# Patient Record
Sex: Male | Born: 1985 | Race: Black or African American | Hispanic: No | Marital: Single | Smoking: Current some day smoker
Health system: Southern US, Community
[De-identification: ages and names within clinical notes are randomized; demographics above are authoritative.]

## PROBLEM LIST (undated history)

## (undated) DIAGNOSIS — F418 Other specified anxiety disorders: Secondary | ICD-10-CM

## (undated) HISTORY — PX: NO PAST SURGERIES: SHX2092

## (undated) HISTORY — DX: Other specified anxiety disorders: F41.8

---

## 2011-11-30 ENCOUNTER — Ambulatory Visit: Payer: BC Managed Care – PPO | Admitting: Family Medicine

## 2011-11-30 VITALS — BP 121/77 | HR 79 | Temp 99.0°F | Resp 16 | Ht 69.0 in | Wt 218.0 lb

## 2011-11-30 DIAGNOSIS — R300 Dysuria: Secondary | ICD-10-CM

## 2011-11-30 LAB — POCT UA - MICROSCOPIC ONLY
Casts, Ur, LPF, POC: NEGATIVE
Crystals, Ur, HPF, POC: NEGATIVE
Epithelial cells, urine per micros: NEGATIVE
Yeast, UA: NEGATIVE

## 2011-11-30 LAB — POCT URINALYSIS DIPSTICK
Bilirubin, UA: NEGATIVE
Blood, UA: NEGATIVE
Nitrite, UA: NEGATIVE
pH, UA: 6

## 2011-11-30 MED ORDER — AZITHROMYCIN 500 MG PO TABS: 1000.0000 mg | ORAL_TABLET | Freq: Every day | ORAL | Status: DC

## 2011-11-30 NOTE — Progress Notes (Signed)
  Subjective:    Patient ID: Timothy Vasquez, male    DOB: [birth year], 26 y.o.   MRN: [id number]  HPI 26 yo male with groin pain.  Started yesterday.  Comes and goes.  Hasn't tried any medicines.  Some dysuria - tingles.  Question slight discharge.  Does have unprotected sex.  SAme symptoms when 19 and had an STD.  Can't remember which.     Review of Systems    Negative except as per HPI  Objective:   Physical Exam  Constitutional: He appears well-developed.  Pulmonary/Chest: Effort normal.  Genitourinary: Testes normal. Circumcised. Penile tenderness present. Discharge found.  Neurological: He is alert.    Results for orders placed in visit on 11/30/11  POCT UA - MICROSCOPIC ONLY      Component Value Range   WBC, Ur, HPF, POC 5-7     RBC, urine, microscopic 6-7     Bacteria, U Microscopic neg     Mucus, UA trace     Epithelial cells, urine per micros neg     Crystals, Ur, HPF, POC neg     Casts, Ur, LPF, POC neg     Yeast, UA neg    POCT URINALYSIS DIPSTICK      Component Value Range   Color, UA yellow     Clarity, UA clear     Glucose, UA neg     Bilirubin, UA neg     Ketones, UA trace     Spec Grav, UA 1.025     Blood, UA neg     pH, UA 6.0     Protein, UA neg     Urobilinogen, UA 2.0     Nitrite, UA neg     Leukocytes, UA Negative           Assessment & Plan:  Dysuria with discharge.  Uriprobe pending.  Chlamydia most likely.  Given Rx for Azithro 1 g x 1.  Discussed that if comes back positive for gonorrhea, he will need to return for shot.  He is okay with that.

## 2011-12-02 ENCOUNTER — Other Ambulatory Visit: Payer: Self-pay | Admitting: Family Medicine

## 2011-12-02 LAB — URINE CULTURE: Colony Count: NO GROWTH

## 2011-12-04 ENCOUNTER — Emergency Department (HOSPITAL_COMMUNITY)
Admission: EM | Admit: 2011-12-04 | Discharge: 2011-12-04 | Disposition: A | Discharge status: Home / Self Care | Payer: No Typology Code available for payment source | Attending: Emergency Medicine | Admitting: Emergency Medicine

## 2011-12-04 ENCOUNTER — Encounter (HOSPITAL_COMMUNITY): Payer: Self-pay | Admitting: Emergency Medicine

## 2011-12-04 ENCOUNTER — Emergency Department (HOSPITAL_COMMUNITY): Payer: No Typology Code available for payment source

## 2011-12-04 DIAGNOSIS — S43429A Sprain of unspecified rotator cuff capsule, initial encounter: Secondary | ICD-10-CM | POA: Insufficient documentation

## 2011-12-04 DIAGNOSIS — M25519 Pain in unspecified shoulder: Secondary | ICD-10-CM | POA: Insufficient documentation

## 2011-12-04 DIAGNOSIS — M542 Cervicalgia: Secondary | ICD-10-CM | POA: Insufficient documentation

## 2011-12-04 DIAGNOSIS — S46019A Strain of muscle(s) and tendon(s) of the rotator cuff of unspecified shoulder, initial encounter: Secondary | ICD-10-CM

## 2011-12-04 DIAGNOSIS — S161XXA Strain of muscle, fascia and tendon at neck level, initial encounter: Secondary | ICD-10-CM

## 2011-12-04 DIAGNOSIS — S060X1A Concussion with loss of consciousness of 30 minutes or less, initial encounter: Secondary | ICD-10-CM | POA: Insufficient documentation

## 2011-12-04 DIAGNOSIS — S139XXA Sprain of joints and ligaments of unspecified parts of neck, initial encounter: Secondary | ICD-10-CM | POA: Insufficient documentation

## 2011-12-04 DIAGNOSIS — R51 Headache: Secondary | ICD-10-CM | POA: Insufficient documentation

## 2011-12-04 DIAGNOSIS — R404 Transient alteration of awareness: Secondary | ICD-10-CM | POA: Insufficient documentation

## 2011-12-04 MED ORDER — CYCLOBENZAPRINE HCL 10 MG PO TABS: 10.0000 mg | ORAL_TABLET | Freq: Two times a day (BID) | ORAL | Status: AC | PRN

## 2011-12-04 MED ORDER — HYDROCODONE-ACETAMINOPHEN 5-325 MG PO TABS: 1.0000 | ORAL_TABLET | ORAL | Status: AC | PRN

## 2011-12-04 NOTE — Discharge Instructions (Signed)
Cervical Sprain A cervical sprain is an injury in the neck in which the ligaments are stretched or torn. The ligaments are the tissues that hold the bones of the neck (vertebrae) in place.Cervical sprains can range from very mild to very severe. Most cervical sprains get better in 1 to 3 weeks, but it depends on the cause and extent of the injury. Severe cervical sprains can cause the neck vertebrae to be unstable. This can lead to damage of the spinal cord and can result in serious nervous system problems. Your caregiver will determine whether your cervical sprain is mild or severe. CAUSES  Severe cervical sprains may be caused by:  Contact sport injuries (football, rugby, wrestling, hockey, auto racing, gymnastics, diving, martial arts, boxing).   Motor vehicle collisions.   Whiplash injuries. This means the neck is forcefully whipped backward and forward.   Falls.  Mild cervical sprains may be caused by:   Awkward positions, such as cradling a telephone between your ear and shoulder.   Sitting in a chair that does not offer proper support.   Working at a poorly designed computer station.   Activities that require looking up or down for long periods of time.  SYMPTOMS   Pain, soreness, stiffness, or a burning sensation in the front, back, or sides of the neck. This discomfort may develop immediately after injury or it may develop slowly and not begin for 24 hours or more after an injury.   Pain or tenderness directly in the middle of the back of the neck.   Shoulder or upper back pain.   Limited ability to move the neck.   Headache.   Dizziness.   Weakness, numbness, or tingling in the hands or arms.   Muscle spasms.   Difficulty swallowing or chewing.   Tenderness and swelling of the neck.  DIAGNOSIS  Most of the time, your caregiver can diagnose this problem by taking your history and doing a physical exam. Your caregiver will ask about any known problems, such as  arthritis in the neck or a previous neck injury. X-rays may be taken to find out if there are any other problems, such as problems with the bones of the neck. However, an X-ray often does not reveal the full extent of a cervical sprain. Other tests such as a computed tomography (CT) scan or magnetic resonance imaging (MRI) may be needed. TREATMENT  Treatment depends on the severity of the cervical sprain. Mild sprains can be treated with rest, keeping the neck in place (immobilization), and pain medicines. Severe cervical sprains need immediate immobilization and an appointment with an orthopedist or neurosurgeon. Several treatment options are available to help with pain, muscle spasms, and other symptoms. Your caregiver may prescribe:  Medicines, such as pain relievers, numbing medicines, or muscle relaxants.   Physical therapy. This can include stretching exercises, strengthening exercises, and posture training. Exercises and improved posture can help stabilize the neck, strengthen muscles, and help stop symptoms from returning.   A neck collar to be worn for short periods of time. Often, these collars are worn for comfort. However, certain collars may be worn to protect the neck and prevent further worsening of a serious cervical sprain.  HOME CARE INSTRUCTIONS   Put ice on the injured area.   Put ice in a plastic bag.   Place a towel between your skin and the bag.   Leave the ice on for 15 to 20 minutes, 3 to 4 times a day.     Only take over-the-counter or prescription medicines for pain, discomfort, or fever as directed by your caregiver.   Keep all follow-up appointments as directed by your caregiver.   Keep all physical therapy appointments as directed by your caregiver.   If a neck collar is prescribed, wear it as directed by your caregiver.   Do not drive while wearing a neck collar.   Make any needed adjustments to your work station to promote good posture.   Avoid positions  and activities that make your symptoms worse.   Warm up and stretch before being active to help prevent problems.  SEEK MEDICAL CARE IF:   Your pain is not controlled with medicine.   You are unable to decrease your pain medicine over time as planned.   Your activity level is not improving as expected.  SEEK IMMEDIATE MEDICAL CARE IF:   You develop any bleeding, stomach upset, or signs of an allergic reaction to your medicine.   Your symptoms get worse.   You develop new, unexplained symptoms.   You have numbness, tingling, weakness, or paralysis in any part of your body.  MAKE SURE YOU:   Understand these instructions.   Will watch your condition.   Will get help right away if you are not doing well or get worse.  Document Released: 06/01/2007 Document Revised: 07/24/2011 Document Reviewed: 05/07/2011 [facility] Patient Information 2012 [place].Concussion and Brain Injury A blow or jolt to the head can disrupt the normal function of the brain. This type of brain injury is often called a "concussion" or a "closed head injury." Concussions are usually not life-threatening. Even so, the effects of a concussion can be serious.  CAUSES  A concussion is caused by a blunt blow to the head. The blow might be direct or indirect as described below.  Direct blow (running into another player during a soccer game, being hit in a fight, or hitting your head on a hard surface).   Indirect blow (when your head moves rapidly and violently back and forth like in a car crash).  SYMPTOMS  The brain is very complex. Every head injury is different. Some symptoms may appear right away. Other symptoms may not show up for days or weeks after the concussion. The signs of concussion can be hard to notice. Early on, problems may be missed by patients, family members, and caregivers. You may look fine even though you are acting or feeling differently.  These symptoms are usually temporary, but may  last for days, weeks, or even longer. Symptoms include:  Mild headaches that will not go away.   Having more trouble than usual with:   Remembering things.   Paying attention or concentrating.   Organizing daily tasks.   Making decisions and solving problems.   Slowness in thinking, acting, speaking, or reading.   Getting lost or easily confused.   Feeling tired all the time or lacking energy (fatigue).   Feeling drowsy.   Sleep disturbances.   Sleeping more than usual.   Sleeping less than usual.   Trouble falling asleep.   Trouble sleeping (insomnia).   Loss of balance or feeling lightheaded or dizzy.   Nausea or vomiting.   Numbness or tingling.   Increased sensitivity to:   Sounds.   Lights.   Distractions.  Other symptoms might include:  Vision problems or eyes that tire easily.   Diminished sense of taste or smell.   Ringing in the ears.   Mood changes such as feeling  sad, anxious, or listless.   Becoming easily irritated or angry for little or no reason.   Lack of motivation.  DIAGNOSIS  Your caregiver can usually diagnose a concussion or mild brain injury based on your description of your injury and your symptoms.  Your evaluation might include:  A brain scan to look for signs of injury to the brain. Even if the test shows no injury, you may still have a concussion.   Blood tests to be sure other problems are not present.  TREATMENT   People with a concussion need to be examined and evaluated. Most people with concussions are treated in an emergency department, urgent care, or clinic. Some people must stay in the hospital overnight for further treatment.   Your caregiver will send you home with important instructions to follow. Be sure to carefully follow them.   Tell your caregiver if you are already taking any medicines (prescription, over-the-counter, or natural remedies), or if you are drinking alcohol or taking illegal drugs. Also,  talk with your caregiver if you are taking blood thinners (anticoagulants) or aspirin. These drugs may increase your chances of complications. All of this is important information that may affect treatment.   Only take over-the-counter or prescription medicines for pain, discomfort, or fever as directed by your caregiver.  PROGNOSIS  How fast people recover from brain injury varies from person to person. Although most people have a good recovery, how quickly they improve depends on many factors. These factors include how severe their concussion was, what part of the brain was injured, their age, and how healthy they were before the concussion.  Because all head injuries are different, so is recovery. Most people with mild injuries recover fully. Recovery can take time. In general, recovery is slower in older persons. Also, persons who have had a concussion in the past or have other medical problems may find that it takes longer to recover from their current injury. Anxiety and depression may also make it harder to adjust to the symptoms of brain injury. HOME CARE INSTRUCTIONS  Return to your normal activities slowly, not all at once. You must give your body and brain enough time for recovery.  Get plenty of sleep at night, and rest during the day. Rest helps the brain to heal.   Avoid staying up late at night.   Keep the same bedtime hours on weekends and weekdays.   Take daytime naps or rest breaks when you feel tired.   Limit activities that require a lot of thought or concentration (brain or cognitive rest). This includes:   Homework or job-related work.   Watching TV.   Computer work.   Avoid activities that could lead to a second brain injury, such as contact or recreational sports, until your caregiver says it is okay. Even after your brain injury has healed, you should protect yourself from having another concussion.   Ask your caregiver when you can return to your normal activities  such as driving, bicycling, or operating heavy equipment. Your ability to react may be slower after a brain injury.   Talk with your caregiver about when you can return to work or school.   Inform your teachers, school nurse, school counselor, coach, Product/process development scientist, or work Freight forwarder about your injury, symptoms, and restrictions. They should be instructed to report:   Increased problems with attention or concentration.   Increased problems remembering or learning new information.   Increased time needed to complete tasks or assignments.  Increased irritability or decreased ability to cope with stress.   Increased symptoms.   Take only those medicines that your caregiver has approved.   Do not drink alcohol until your caregiver says you are well enough to do so. Alcohol and certain other drugs may slow your recovery and can put you at risk of further injury.   If it is harder than usual to remember things, write them down.   If you are easily distracted, try to do one thing at a time. For example, do not try to watch TV while fixing dinner.   Talk with family members or close friends when making important decisions.   Keep all follow-up appointments. Repeated evaluation of your symptoms is recommended for your recovery.  PREVENTION  Protect your head from future injury. It is very important to avoid another head or brain injury before you have recovered. In rare cases, another injury has lead to permanent brain damage, brain swelling, or death. Avoid injuries by using:  Seatbelts when riding in a car.   Alcohol only in moderation.   A helmet when biking, skiing, skateboarding, skating, or doing similar activities.   Safety measures in your home.   Remove clutter and tripping hazards from floors and stairways.   Use grab bars in bathrooms and handrails by stairs.   Place non-slip mats on floors and in bathtubs.   Improve lighting in dim areas.  SEEK MEDICAL CARE IF:  A  head injury can cause lingering symptoms. You should seek medical care if you have any of the following symptoms for more than 3 weeks after your injury or are planning to return to sports:  Chronic headaches.   Dizziness or balance problems.   Nausea.   Vision problems.   Increased sensitivity to noise or light.   Depression or mood swings.   Anxiety or irritability.   Memory problems.   Difficulty concentrating or paying attention.   Sleep problems.   Feeling tired all the time.  SEEK IMMEDIATE MEDICAL CARE IF:  You have had a blow or jolt to the head and you (or your family or friends) notice:  Severe or worsening headaches.   Weakness (even if only in one hand or one leg or one part of the face), numbness, or decreased coordination.   Repeated vomiting.   Increased sleepiness or passing out.   One black center of the eye (pupil) is larger than the other.   Convulsions (seizures).   Slurred speech.   Increasing confusion, restlessness, agitation, or irritability.   Lack of ability to recognize people or places.   Neck pain.   Difficulty being awakened.   Unusual behavior changes.   Loss of consciousness.  Older adults with a brain injury may have a higher risk of serious complications such as a blood clot on the brain. Headaches that get worse or an increase in confusion are signs of this complication. If these signs occur, see a caregiver right away. MAKE SURE YOU:   Understand these instructions.   Will watch your condition.   Will get help right away if you are not doing well or get worse.  FOR MORE INFORMATION  Several groups help people with brain injury and their families. They provide information and put people in touch with local resources. These include support groups, rehabilitation services, and a variety of health care professionals. Among these groups, the Brain Injury Association (BIA, www.biausa.org) has a Producer, television/film/video that gathers  scientific and educational information and  works on a national level to help people with brain injury.  Document Released: 10/25/2003 Document Revised: 07/24/2011 Document Reviewed: 03/22/2008 [facility] Patient Information 2012 [place], [place].Rotator Cuff Injury The rotator cuff is the collective set of muscles and tendons that make up the stabilizing unit of your shoulder. This unit holds in the ball of the humerus (upper arm bone) in the socket of the scapula (shoulder blade). Injuries to this stabilizing unit most commonly come from sports or activities that cause the arm to be moved repeatedly over the head. Examples of this include throwing, weight lifting, swimming, racquet sports, or an injury such as falling on your arm. Chronic (longstanding) irritation of this unit can cause inflammation (soreness), bursitis, and eventual damage to the tendons to the point of rupture (tear). An acute (sudden) injury of the rotator cuff can result in a partial or complete tear. You may need surgery with complete tears. Small or partial rotator cuff tears may be treated conservatively with temporary immobilization, exercises and rest. Physical therapy may be needed. HOME CARE INSTRUCTIONS   Apply ice to the injury for 15 to 20 minutes 3 to 4 times per day for the first 2 days. Put the ice in a plastic bag and place a towel between the bag of ice and your skin.   If you have a shoulder immobilizer (sling and straps), do not remove it for as long as directed by your caregiver or until you see a caregiver for a follow-up examination. If you need to remove it, move your arm as little as possible.   You may want to sleep on several pillows or in a recliner at night to lessen swelling and pain.   Only take over-the-counter or prescription medicines for pain, discomfort, or fever as directed by your caregiver.   Do simple hand squeezing exercises with a soft rubber ball to decrease hand swelling.  SEEK MEDICAL CARE  IF:   Pain in your shoulder increases or new pain or numbness develops in your arm, hand, or fingers.   Your hand or fingers are colder than your other hand.  SEEK IMMEDIATE MEDICAL CARE IF:   Your arm, hand, or fingers are numb or tingling.   Your arm, hand, or fingers are increasingly swollen and painful, or turn white or blue.  Document Released: 08/01/2000 Document Revised: 07/24/2011 Document Reviewed: 07/25/2008 [facility] Patient Information 2012 [place], [place].

## 2011-12-04 NOTE — ED Provider Notes (Signed)
Medical screening examination/treatment/procedure(s) were performed by non-physician practitioner and as supervising physician I was immediately available for consultation/collaboration.   Kalila Adkison, MD 12/04/11 [id number] 

## 2011-12-04 NOTE — ED Notes (Signed)
PT ambulated with a steady gait; VSS; A&Ox3; no signs of distress; respirations even and unlabored; skin warm and dry; no questions at this time.  
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
## 2011-12-04 NOTE — ED Notes (Signed)
C-collar placed on pt.
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
## 2018-03-29 ENCOUNTER — Other Ambulatory Visit (HOSPITAL_COMMUNITY)
Admission: RE | Admit: 2018-03-29 | Discharge: 2018-03-29 | Disposition: A | Discharge status: Home / Self Care | Payer: Commercial Managed Care - PPO | Source: Ambulatory Visit | Attending: Family Medicine | Admitting: Family Medicine

## 2018-03-29 ENCOUNTER — Encounter: Payer: Self-pay | Admitting: Family Medicine

## 2018-03-29 ENCOUNTER — Ambulatory Visit (INDEPENDENT_AMBULATORY_CARE_PROVIDER_SITE_OTHER): Payer: Commercial Managed Care - PPO | Admitting: Family Medicine

## 2018-03-29 VITALS — BP 110/82 | HR 75 | Temp 99.0°F | Ht 70.0 in | Wt 197.1 lb

## 2018-03-29 DIAGNOSIS — R41840 Attention and concentration deficit: Secondary | ICD-10-CM | POA: Diagnosis not present

## 2018-03-29 DIAGNOSIS — Z113 Encounter for screening for infections with a predominantly sexual mode of transmission: Secondary | ICD-10-CM | POA: Insufficient documentation

## 2018-03-29 DIAGNOSIS — F418 Other specified anxiety disorders: Secondary | ICD-10-CM

## 2018-03-29 DIAGNOSIS — B353 Tinea pedis: Secondary | ICD-10-CM | POA: Diagnosis not present

## 2018-03-29 MED ORDER — KETOCONAZOLE 2 % EX CREA
1.0000 | TOPICAL_CREAM | Freq: Every day | CUTANEOUS | 0 refills | Status: AC
Start: 2018-03-29 — End: 2018-04-12

## 2018-03-29 MED ORDER — ESCITALOPRAM OXALATE 10 MG PO TABS: 10.0000 mg | ORAL_TABLET | Freq: Every day | ORAL | 2 refills | Status: AC
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
## 2018-03-29 NOTE — Progress Notes (Signed)
Pre visit review using our clinic review tool, if applicable. No additional management support is needed unless otherwise documented below in the visit note. 

## 2018-03-29 NOTE — Patient Instructions (Signed)
If you do not hear anything about your referral in the next 1-2 weeks, call our office and ask for an update.  Please consider counseling. Contact [phone] to schedule an appointment or inquire about cost/insurance coverage.  Coping skills Choose 5 that work for you: Take a deep breath Count to 20 Read a book Do a puzzle Meditate Bake Sing Knit Garden Pray Go outside Call a friend Listen to music Take a walk Color Send a note Take a bath Watch a movie Be alone in a quiet place Pet an animal Visit a friend Journal Exercise Stretch   

## 2018-03-31 LAB — URINE CYTOLOGY ANCILLARY ONLY
CHLAMYDIA, DNA PROBE: NEGATIVE
Neisseria Gonorrhea: NEGATIVE
Trichomonas: NEGATIVE

## 2018-04-02 ENCOUNTER — Encounter: Payer: Self-pay | Admitting: Family Medicine

## 2018-04-30 ENCOUNTER — Ambulatory Visit: Payer: Commercial Managed Care - PPO | Admitting: Psychology

## 2018-05-21 ENCOUNTER — Encounter: Payer: Self-pay | Admitting: Family Medicine
